# Patient Record
Sex: Female | Born: 2000 | Race: White | Hispanic: No | Smoking: Never smoker
Health system: Southern US, Community
[De-identification: ages and names within clinical notes are randomized; demographics above are authoritative.]

## PROBLEM LIST (undated history)

## (undated) DIAGNOSIS — G43909 Migraine, unspecified, not intractable, without status migrainosus: Secondary | ICD-10-CM

## (undated) DIAGNOSIS — J45909 Unspecified asthma, uncomplicated: Secondary | ICD-10-CM

## (undated) HISTORY — PX: TONSILLECTOMY: SUR1361

---
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
## 2021-09-10 ENCOUNTER — Telehealth: Payer: Self-pay | Admitting: Adult Health

## 2021-09-10 NOTE — Telephone Encounter (Signed)
Called and left vm that I am returning her call.  ?

## 2021-09-10 NOTE — Telephone Encounter (Signed)
Spoke with patient. She was concerned with irregular bleeding and spotting. Also has cramps from time to time. Pt was informed that this was all common with the nexplanon. Advised to call us with worsening pain, discharge or heavy bleeding. No other questions at this time.  ?

## 2021-09-10 NOTE — Telephone Encounter (Signed)
Patient called stating that she had a nexplanon inserted in 2022 and she recently had some spotting. Patient states it is like a brownish discharge, patient would like to know if this is the end of her period or what does this mean. Please contact pt ?

## 2023-01-11 IMAGING — CR DG ABDOMEN 1V
2 series · 2 of 2 positions shown · non-contrast
Comparison: None.

CLINICAL DATA: Abdominal pain. Right-sided lower abdominal pain.
History of gallstones.

EXAM:
ABDOMEN - 1 VIEW

[t abdomen supine (1 of 2)]
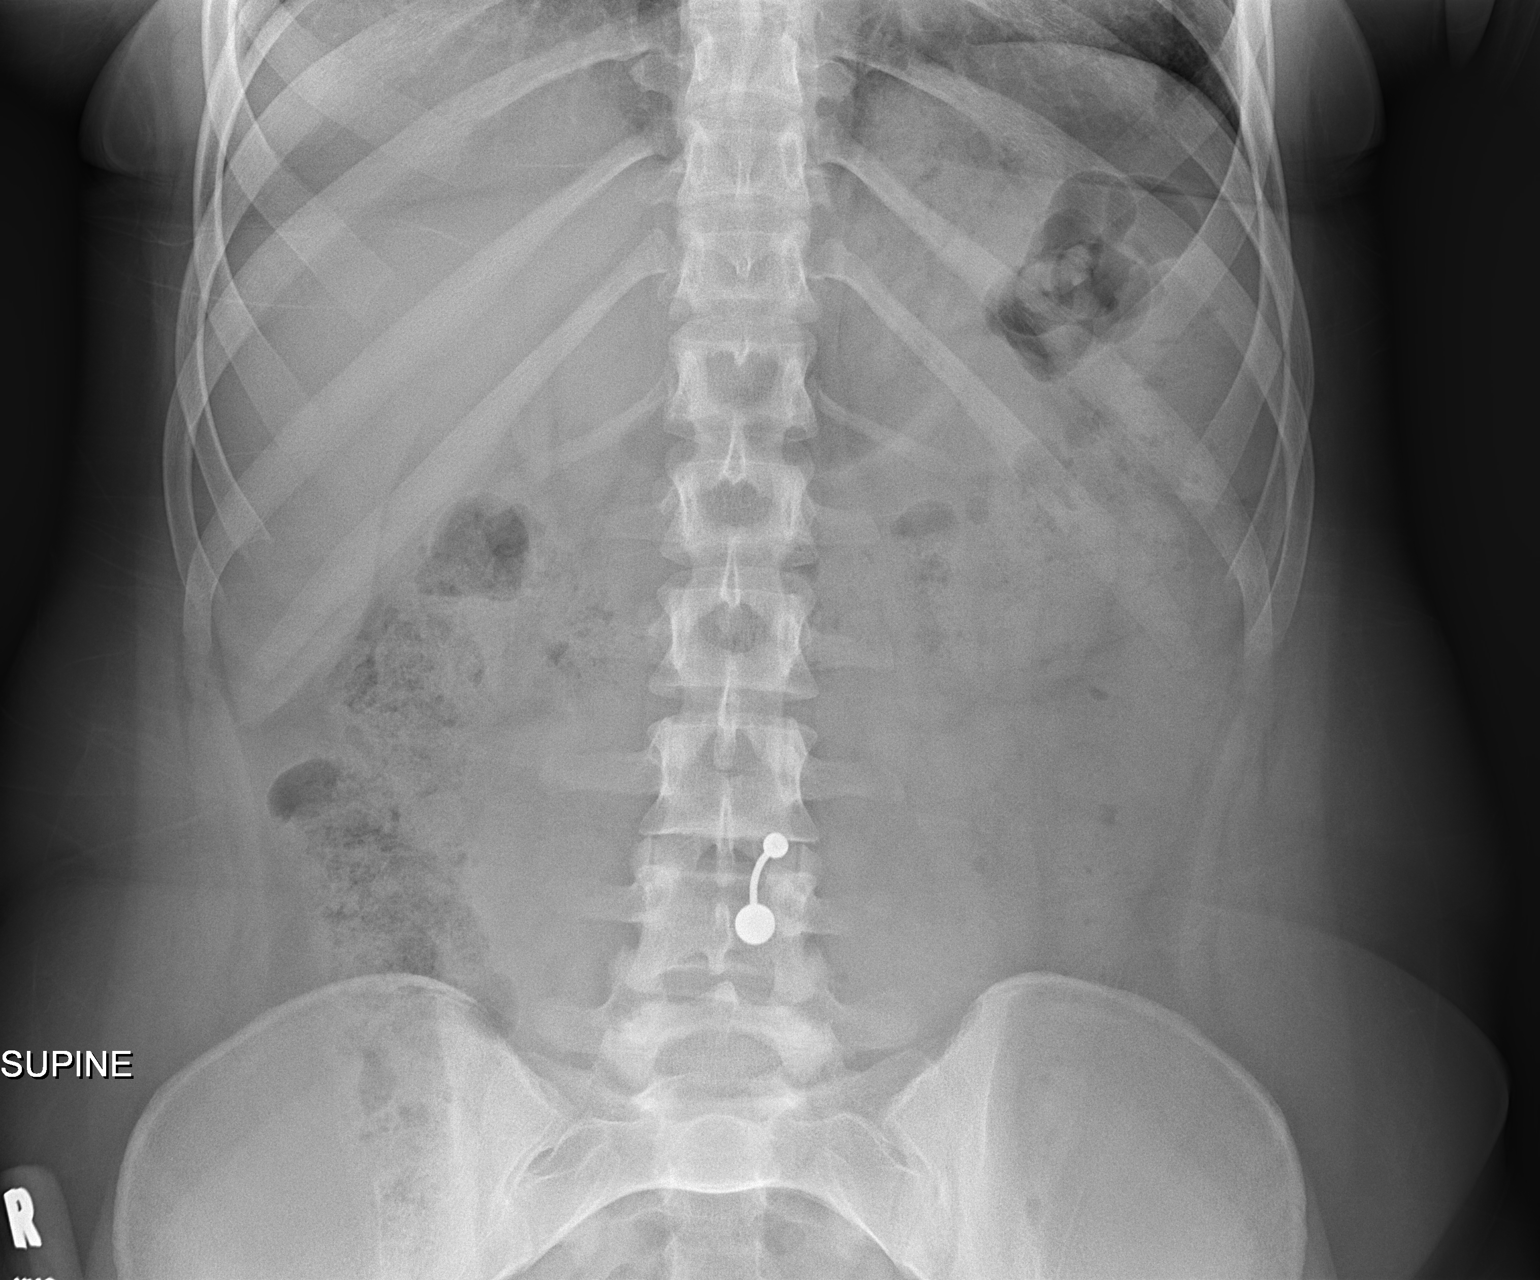

[t abdomen supine (2 of 2)]
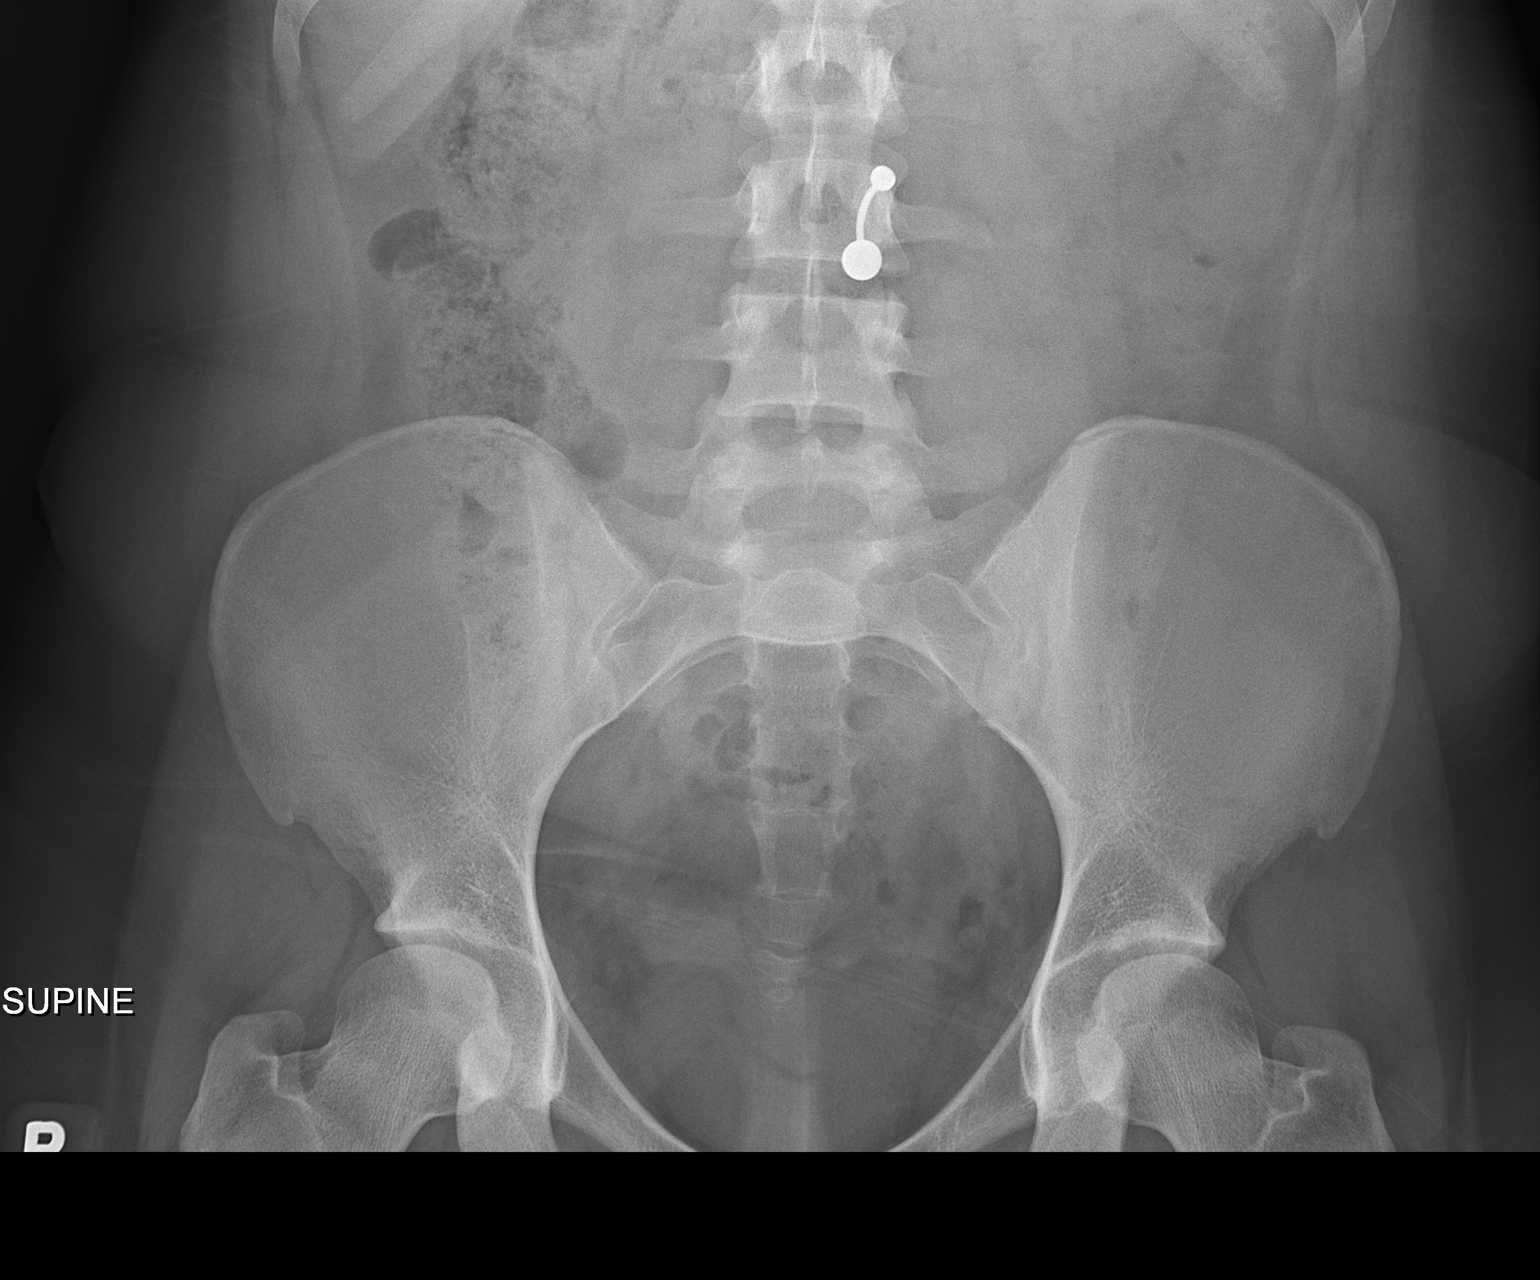

[2 of 2 positions shown; findings below may reference images not displayed]

FINDINGS: Divided supine view of the abdomen. No evidence of free air. No
bowel dilatation to suggest obstruction. Moderate stool in the
ascending and transverse colon. Small volume of stool in the
descending and sigmoid colon. No abnormal rectal distention. No
radiopaque calculi or abnormal soft tissue calcifications. No
gallstones are seen on the current exam. No concerning
intraabdominal mass effect. No osseous abnormalities are seen.
IMPRESSION: Unremarkable radiographs of the abdomen.  Normal bowel gas pattern.

## 2023-07-28 ENCOUNTER — Ambulatory Visit (INDEPENDENT_AMBULATORY_CARE_PROVIDER_SITE_OTHER): Payer: Medicaid Other | Admitting: Advanced Practice Midwife

## 2023-07-28 ENCOUNTER — Encounter: Payer: Self-pay | Admitting: Advanced Practice Midwife

## 2023-07-28 VITALS — Ht 61.0 in | Wt 190.0 lb

## 2023-07-28 DIAGNOSIS — Z30017 Encounter for initial prescription of implantable subdermal contraceptive: Secondary | ICD-10-CM

## 2023-07-28 DIAGNOSIS — N926 Irregular menstruation, unspecified: Secondary | ICD-10-CM | POA: Diagnosis not present

## 2023-07-28 DIAGNOSIS — Z3046 Encounter for surveillance of implantable subdermal contraceptive: Secondary | ICD-10-CM | POA: Diagnosis not present

## 2023-07-28 LAB — POCT URINE PREGNANCY: Preg Test, Ur: NEGATIVE

## 2023-07-28 MED ORDER — ETONOGESTREL 68 MG ~~LOC~~ IMPL
68.0000 mg | DRUG_IMPLANT | Freq: Once | SUBCUTANEOUS | Status: AC
  Administered 2023-07-28: 68 mg via SUBCUTANEOUS

## 2023-07-28 NOTE — Progress Notes (Signed)
   NEXPLANON  REMOVAL AND RE-INSERTION Patient name: Denise Fletcher MRN [id number]  Date of birth: 04/02/2001 Subjective Findings:   Denise Fletcher is a 23 y.o. G0P0000 Caucasian female being seen today for Nexplanon  removal and re-insertion. Her Nexplanon  was placed October 13, 2020.   Patient's last menstrual period was 07/25/2023. Last pap plans to get at [facility] (closer to her; we are an hour away). Results were: N/A  Risks/benefits/side effects of Nexplanon  have been discussed and her questions have been answered.  Specifically, a failure rate of 06/998 has been reported, with an increased failure rate if pt takes St. John's Wort and/or antiseizure medicaitons.  She is aware of the common side effect of irregular bleeding, which the incidence of decreases over time. Signed copy of informed consent in chart.      08/15/2020    3:40 PM  Depression screen PHQ 2/9  Decreased Interest 0  Down, Depressed, Hopeless 0  PHQ - 2 Score 0  Altered sleeping 1  Tired, decreased energy 0  Change in appetite 0  Feeling bad or failure about yourself  0  Trouble concentrating 0  Moving slowly or fidgety/restless 0  Suicidal thoughts 0  PHQ-9 Score 1        08/15/2020    3:41 PM  GAD 7 : Generalized Anxiety Score  Nervous, Anxious, on Edge 0  Control/stop worrying 0  Worry too much - different things 0  Trouble relaxing 0  Restless 0  Easily annoyed or irritable 0  Afraid - awful might happen 0  Total GAD 7 Score 0     Pertinent History Reviewed:   Reviewed past medical,surgical, social, obstetrical and family history.  Reviewed problem list, medications and allergies. Objective Findings & Procedure:    Vitals:   07/28/23 1525  Weight: 190 lb (86.2 kg)  Height: 5' 1 (1.549 m)  Body mass index is 35.9 kg/m.  No results found for this or any previous visit (from the past 24 hours).   Time out was performed.  Nexplanon  site identified.  Area prepped in usual sterile fashon.  Two cc's of 2% lidocaine was used to anesthetize the area. A small stab incision was made right beside the implant on the distal portion.  The Nexplanon  rod was grasped using hemostats and removed intact without difficulty.  The area was cleansed again with betadine and the Nexplanon  was inserted approximately 10cm from the medial epicondyle and 3-5cm posterior to the sulcus per manufacturer's recommendations without difficulty.  Steri-strips and a pressure bandage was applied.  There was less than 3 cc blood loss. There were no complications.  The patient tolerated the procedure well. Assessment & Plan:   1) Nexplanon  removal & re-insertion She was instructed to keep the area clean and dry, remove pressure bandage in 24 hours, and keep insertion site covered with the steri-strips for 3-5 days.  She was given a card indicating date Nexplanon  was inserted and date it needs to be removed.  Follow-up PRN problems.  EXP: 2027-02 LOT: [id number]  Orders Placed This Encounter  Procedures   POCT urine pregnancy    Follow-up: Return for prn.  Suzen JONETTA Gentry CNM 07/28/2023 4:02 PM
